# Patient Record
Sex: Male | Born: 1995 | Race: White | Hispanic: No | Marital: Single | Smoking: Current every day smoker
Health system: Southern US, Community
[De-identification: ages and names within clinical notes are randomized; demographics above are authoritative.]

---

## 2016-08-11 ENCOUNTER — Emergency Department (HOSPITAL_BASED_OUTPATIENT_CLINIC_OR_DEPARTMENT_OTHER): Payer: Self-pay

## 2016-08-11 ENCOUNTER — Emergency Department (HOSPITAL_BASED_OUTPATIENT_CLINIC_OR_DEPARTMENT_OTHER)
Admission: EM | Admit: 2016-08-11 | Discharge: 2016-08-11 | Disposition: A | Discharge status: Home / Self Care | Payer: Self-pay | Attending: Emergency Medicine | Admitting: Emergency Medicine

## 2016-08-11 ENCOUNTER — Encounter (HOSPITAL_BASED_OUTPATIENT_CLINIC_OR_DEPARTMENT_OTHER): Payer: Self-pay | Admitting: *Deleted

## 2016-08-11 DIAGNOSIS — Y999 Unspecified external cause status: Secondary | ICD-10-CM | POA: Insufficient documentation

## 2016-08-11 DIAGNOSIS — F17210 Nicotine dependence, cigarettes, uncomplicated: Secondary | ICD-10-CM | POA: Insufficient documentation

## 2016-08-11 DIAGNOSIS — Y939 Activity, unspecified: Secondary | ICD-10-CM | POA: Insufficient documentation

## 2016-08-11 DIAGNOSIS — S62346A Nondisplaced fracture of base of fifth metacarpal bone, right hand, initial encounter for closed fracture: Secondary | ICD-10-CM | POA: Insufficient documentation

## 2016-08-11 DIAGNOSIS — Y929 Unspecified place or not applicable: Secondary | ICD-10-CM | POA: Insufficient documentation

## 2016-08-11 DIAGNOSIS — W2201XA Walked into wall, initial encounter: Secondary | ICD-10-CM | POA: Insufficient documentation

## 2016-08-11 MED ORDER — OXYCODONE-ACETAMINOPHEN 5-325 MG PO TABS: 1.0000 | ORAL_TABLET | Freq: Four times a day (QID) | ORAL | 0 refills | Status: DC | PRN

## 2016-08-11 MED ORDER — OXYCODONE-ACETAMINOPHEN 5-325 MG PO TABS
1.0000 | ORAL_TABLET | Freq: Once | ORAL | Status: AC
  Administered 2016-08-11: 1 via ORAL
  Filled 2016-08-11: qty 1

## 2016-08-11 NOTE — ED Triage Notes (Signed)
Pt punched wall and has pain in right hand.

## 2016-08-11 NOTE — ED Provider Notes (Signed)
[facility]   CSN: [id number] Arrival date & time: 08/11/16  2024  By signing my name below, I, Rosario Adie, attest that this documentation has been prepared under the direction and in the presence of Audry Pili, PA-C.  Electronically Signed: Rosario Adie, ED Scribe. 08/11/16. 10:14 PM.  History   Chief Complaint Chief Complaint  Patient presents with  . Hand Pain   The history is provided by the patient. No language interpreter was used.   HPI Comments: Stephen Dunn is a 20 y.o. male with no pertinent PMhx, who presents to the Emergency Department complaining of sudden onset, gradually worsening, constant right hand pain onset ~5 hours ago. He notes associated mild edema to the area of pain. Pt reports that he got upset and punched a wall, sustaining his hand pain. His pain is exacerbated with any movement and palpation. No treatments were tried prior to coming into the ED. Denies weakness, numbness. No other symptoms noted.     History reviewed. No pertinent past medical history.  There are no active problems to display for this patient.  History reviewed. No pertinent surgical history.  Home Medications    Prior to Admission medications   Not on File   Family History No family history on file.  Social History Social History  Substance Use Topics  . Smoking status: Current Every Day Smoker    Packs/day: 1.00    Types: Cigarettes  . Smokeless tobacco: Not on file  . Alcohol use No   Allergies   Review of patient's allergies indicates no known allergies.  Review of Systems Review of Systems  Musculoskeletal: Positive for arthralgias (right hand) and myalgias.  Neurological: Negative for weakness and numbness.   Physical Exam Updated Vital Signs BP (!) 162/112   Pulse 104   Temp 98.3 F (36.8 C) (Oral)   Resp 16   Ht 6' (1.829 m)   Wt 150 lb (68 kg)   SpO2 98%   BMI 20.34 kg/m   Physical Exam  Constitutional:  He is oriented to person, place, and time. Vital signs are normal. He appears well-developed and well-nourished.  HENT:  Head: Normocephalic.  Right Ear: Hearing normal.  Left Ear: Hearing normal.  Eyes: Conjunctivae and EOM are normal. Pupils are equal, round, and reactive to light.  Cardiovascular: Normal rate and regular rhythm.   Pulmonary/Chest: Effort normal. No respiratory distress.  Abdominal: He exhibits no distension.  Musculoskeletal:  Mild swelling on the dorsal aspect of the base of the fifth MCP. NV intact. Pulses, motor, and sensation intact distally. Normal ROM of the wrist.   Neurological: He is alert and oriented to person, place, and time.  Skin: Skin is warm and dry.  Psychiatric: He has a normal mood and affect. His speech is normal and behavior is normal. Thought content normal.  Nursing note and vitals reviewed.  ED Treatments / Results  DIAGNOSTIC STUDIES: Oxygen Saturation is 98% on RA, normal by my interpretation.   COORDINATION OF CARE: 10:13 PM-Discussed next steps with pt. Pt verbalized understanding and is agreeable with the plan.   Radiology Dg Hand Complete Right  Result Date: 08/11/2016 CLINICAL DATA:  Struck a wall 2 hours ago, hand pain EXAM: RIGHT HAND - COMPLETE 3+ VIEW COMPARISON:  None. FINDINGS: Three views of the right hand submitted. There is mild impacted fracture at the base of right fifth metacarpal. IMPRESSION: Mild impacted fracture at the base of right fifth metacarpal. Electronically Signed  By: Natasha MeadLiviu  Pop M.D.  On: 08/11/2016 21:31   Procedures Procedures   Medications Ordered in ED Medications - No data to display  Initial Impression / Assessment and Plan / ED Course  I have reviewed the triage vital signs and the nursing notes.  Pertinent labs & imaging results that were available during my care of the patient were reviewed by me and considered in my medical decision making (see chart for details).  Clinical Course   I  have reviewed and evaluated the relevant imaging studies. I obtained HPI from historian.  ED Course:  Assessment: Patient X-Ray remarkable for mild impacted fx at the right base of the base of the right fifth MCP. Pain managed in ED. Pt advised to follow up with orthopedics promptly after d/c for continued at home care of injuries. Patient given splint while in ED, conservative therapy recommended and discussed. Patient will be dc home & is agreeable with above plan.  Disposition/Plan:  DC Home Additional Verbal discharge instructions given and discussed with patient.  Pt Instructed to f/u with PCP in the next week for evaluation and treatment of symptoms. Return precautions given Pt acknowledges and agrees with plan  Supervising Physician Lavera Guiseana Duo Liu, MD  Final Clinical Impressions(s) / ED Diagnoses   Final diagnoses:  Closed nondisp fracture of base of fifth metacarpal bone of right hand, initial encounter    New Prescriptions New Prescriptions   No medications on file   I personally performed the services described in this documentation, which was scribed in my presence. The recorded information has been reviewed and is accurate.     Audry Piliyler Arturo Freundlich, PA-C 08/11/16 [id number]    Lavera Guiseana Duo Liu, MD 08/12/16 1034

## 2016-08-11 NOTE — Discharge Instructions (Signed)
Please read and follow all provided instructions.  Your diagnoses today include:  1. Closed nondisp fracture of base of fifth metacarpal bone of right hand, initial encounter     Tests performed today include: Vital signs. See below for your results today.   Medications prescribed:  Take as prescribed   Home care instructions:  Follow any educational materials contained in this packet.  Follow-up instructions: Please follow-up with Orthopedics  for further evaluation of symptoms and treatment   Return instructions:  Please return to the Emergency Department if you do not get better, if you get worse, or new symptoms OR  - Fever (temperature greater than 101.53F)  - Bleeding that does not stop with holding pressure to the area    -Severe pain (please note that you may be more sore the day after your accident)  - Chest Pain  - Difficulty breathing  - Severe nausea or vomiting  - Inability to tolerate food and liquids  - Passing out  - Skin becoming red around your wounds  - Change in mental status (confusion or lethargy)  - New numbness or weakness    Please return if you have any other emergent concerns.  Additional Information:  Your vital signs today were: BP 141/94 (BP Location: Right Arm)    Pulse 75    Temp 98.3 F (36.8 C) (Oral)    Resp 18    Ht 6' (1.829 m)    Wt 68 kg    SpO2 99%    BMI 20.34 kg/m  If your blood pressure (BP) was elevated above 135/85 this visit, please have this repeated by your doctor within one month. ---------------

## 2016-08-11 NOTE — ED Notes (Signed)
PA at bedside.

## 2017-01-10 ENCOUNTER — Encounter (HOSPITAL_BASED_OUTPATIENT_CLINIC_OR_DEPARTMENT_OTHER): Payer: Self-pay | Admitting: *Deleted

## 2017-01-10 ENCOUNTER — Emergency Department (HOSPITAL_BASED_OUTPATIENT_CLINIC_OR_DEPARTMENT_OTHER)
Admission: EM | Admit: 2017-01-10 | Discharge: 2017-01-10 | Disposition: A | Discharge status: Home / Self Care | Payer: Self-pay | Attending: Physician Assistant | Admitting: Physician Assistant

## 2017-01-10 DIAGNOSIS — R05 Cough: Secondary | ICD-10-CM | POA: Insufficient documentation

## 2017-01-10 DIAGNOSIS — R112 Nausea with vomiting, unspecified: Secondary | ICD-10-CM | POA: Insufficient documentation

## 2017-01-10 DIAGNOSIS — R197 Diarrhea, unspecified: Secondary | ICD-10-CM | POA: Insufficient documentation

## 2017-01-10 DIAGNOSIS — R109 Unspecified abdominal pain: Secondary | ICD-10-CM | POA: Insufficient documentation

## 2017-01-10 DIAGNOSIS — R0781 Pleurodynia: Secondary | ICD-10-CM | POA: Insufficient documentation

## 2017-01-10 DIAGNOSIS — F17210 Nicotine dependence, cigarettes, uncomplicated: Secondary | ICD-10-CM | POA: Insufficient documentation

## 2017-01-10 LAB — CBC WITH DIFFERENTIAL/PLATELET
BASOS PCT: 1 %
Basophils Absolute: 0 10*3/uL (ref 0.0–0.1)
EOS ABS: 0.3 10*3/uL (ref 0.0–0.7)
EOS PCT: 7 %
HCT: 47.8 % (ref 39.0–52.0)
HEMOGLOBIN: 16.8 g/dL (ref 13.0–17.0)
Lymphocytes Relative: 28 %
Lymphs Abs: 1.1 10*3/uL (ref 0.7–4.0)
MCH: 30.1 pg (ref 26.0–34.0)
MCHC: 35.1 g/dL (ref 30.0–36.0)
MCV: 85.7 fL (ref 78.0–100.0)
Monocytes Absolute: 0.8 10*3/uL (ref 0.1–1.0)
Monocytes Relative: 22 %
NEUTROS PCT: 42 %
Neutro Abs: 1.6 10*3/uL — ABNORMAL LOW (ref 1.7–7.7)
PLATELETS: 126 10*3/uL — AB (ref 150–400)
RBC: 5.58 MIL/uL (ref 4.22–5.81)
RDW: 12.6 % (ref 11.5–15.5)
WBC: 3.8 10*3/uL — AB (ref 4.0–10.5)

## 2017-01-10 LAB — COMPREHENSIVE METABOLIC PANEL
ALBUMIN: 4.1 g/dL (ref 3.5–5.0)
ALK PHOS: 49 U/L (ref 38–126)
ALT: 28 U/L (ref 17–63)
ANION GAP: 8 (ref 5–15)
AST: 51 U/L — ABNORMAL HIGH (ref 15–41)
BUN: 11 mg/dL (ref 6–20)
CALCIUM: 8.9 mg/dL (ref 8.9–10.3)
CHLORIDE: 96 mmol/L — AB (ref 101–111)
CO2: 29 mmol/L (ref 22–32)
Creatinine, Ser: 1 mg/dL (ref 0.61–1.24)
GFR calc Af Amer: >60 mL/min (ref >60)
GFR calc non Af Amer: >60 mL/min (ref >60)
GLUCOSE: 97 mg/dL (ref 65–99)
Potassium: 3.3 mmol/L — ABNORMAL LOW (ref 3.5–5.1)
Sodium: 133 mmol/L — ABNORMAL LOW (ref 135–145)
Total Bilirubin: 0.7 mg/dL (ref 0.3–1.2)
Total Protein: 7.6 g/dL (ref 6.5–8.1)

## 2017-01-10 LAB — URINALYSIS, ROUTINE W REFLEX MICROSCOPIC
BILIRUBIN URINE: NEGATIVE
Glucose, UA: NEGATIVE mg/dL
Hgb urine dipstick: NEGATIVE
KETONES UR: NEGATIVE mg/dL
Leukocytes, UA: NEGATIVE
NITRITE: NEGATIVE
Protein, ur: NEGATIVE mg/dL
Specific Gravity, Urine: 1.013 (ref 1.005–1.030)
pH: 6 (ref 5.0–8.0)

## 2017-01-10 MED ORDER — ALBUTEROL SULFATE (2.5 MG/3ML) 0.083% IN NEBU
5.0000 mg | INHALATION_SOLUTION | Freq: Once | RESPIRATORY_TRACT | Status: AC
  Administered 2017-01-10: 5 mg via RESPIRATORY_TRACT
  Filled 2017-01-10: qty 6

## 2017-01-10 MED ORDER — ONDANSETRON HCL 4 MG PO TABS: 4.0000 mg | ORAL_TABLET | Freq: Three times a day (TID) | ORAL | 0 refills | Status: AC | PRN

## 2017-01-10 MED ORDER — SODIUM CHLORIDE 0.9 % IV BOLUS (SEPSIS)
1000.0000 mL | Freq: Once | INTRAVENOUS | Status: AC
  Administered 2017-01-10: 1000 mL via INTRAVENOUS

## 2017-01-10 MED ORDER — ONDANSETRON HCL 4 MG/2ML IJ SOLN
4.0000 mg | Freq: Once | INTRAMUSCULAR | Status: AC
  Administered 2017-01-10: 4 mg via INTRAVENOUS
  Filled 2017-01-10: qty 2

## 2017-01-10 NOTE — ED Notes (Signed)
EDP into room, prior to RN assessment, see MD notes, pending orders.   

## 2017-01-10 NOTE — Discharge Instructions (Signed)
You were seen today for vomiting and diarrhea. Your labs are reassuring. Please use Zofran to help with your symptoms. Please return with any concerns

## 2017-01-10 NOTE — ED Provider Notes (Signed)
[facility]   CSN: [id number] Arrival date & time: 01/10/17  2056  By signing my name below, I, Modena JanskyAlbert Thayil, attest that this documentation has been prepared under the direction and in the presence of Keoki Mchargue Randall AnLyn Manhattan Mccuen, MD. Electronically Signed: Modena JanskyAlbert Thayil, Scribe. 01/10/2017. 10:17 PM.  History   Chief Complaint Chief Complaint  Patient presents with  . Emesis   The history is provided by the patient. No language interpreter was used.   HPI Comments: Stephen Dunn is a 21 y.o. male who presents to the Emergency Department complaining of intermittent moderate cough that started a few days ago. He states he went to [facility] 2 days ago for his gradually worsening URI-like symptoms. He was discharged with cough syrup and an antibiotic with minimal relief. He came to the ED today due to his unchanged progression. He has been taking aleve for his pain with some relief. He reports associated symptoms of rib pain (secondary to cough), abdominal pain (secondary to cough), diarrhea, vomiting, and decreased appetite (not eaten in past 3 days). He denies any other complaints.   History reviewed. No pertinent past medical history.  There are no active problems to display for this patient.   History reviewed. No pertinent surgical history.     Home Medications    Prior to Admission medications   Not on File    Family History History reviewed. No pertinent family history.  Social History Social History  Substance Use Topics  . Smoking status: Current Every Day Smoker    Packs/day: 1.00    Types: Cigarettes  . Smokeless tobacco: Not on file  . Alcohol use No     Allergies   Patient has no known allergies.   Review of Systems Review of Systems  Constitutional: Positive for appetite change.  Cardiovascular: Positive for chest pain (Rib pain due to pain).  Gastrointestinal: Positive for abdominal pain (from cough), diarrhea and  vomiting.  All other systems reviewed and are negative.    Physical Exam Updated Vital Signs BP (!) 159/102 (BP Location: Left Arm)   Pulse 76   Temp 98.1 F (36.7 C) (Oral)   Resp 20   Ht 5\' 9"  (1.753 m)   Wt 150 lb (68 kg)   SpO2 100%   BMI 22.15 kg/m   Physical Exam  Constitutional: He appears well-developed and well-nourished. No distress.  HENT:  Head: Normocephalic and atraumatic.  Eyes: Conjunctivae are normal.  Neck: Neck supple.  Cardiovascular: Normal rate and regular rhythm.   Pulmonary/Chest: Effort normal. No respiratory distress. He has no wheezes. He has no rales.  Abdominal: Soft. There is no tenderness.  Musculoskeletal: Normal range of motion.  Neurological: He is alert.  Skin: Skin is warm and dry.  Psychiatric: He has a normal mood and affect.  Nursing note and vitals reviewed.    ED Treatments / Results  DIAGNOSTIC STUDIES: Oxygen Saturation is 100% on RA, normal by my interpretation.    COORDINATION OF CARE: 10:21 PM- Pt advised of plan for treatment and pt agrees.  Labs (all labs ordered are listed, but only abnormal results are displayed) Labs Reviewed  URINALYSIS, ROUTINE W REFLEX MICROSCOPIC  CBC WITH DIFFERENTIAL/PLATELET  COMPREHENSIVE METABOLIC PANEL    EKG  EKG Interpretation None       Radiology No results found.  Procedures Procedures (including critical care time)  Medications Ordered in ED Medications  sodium chloride 0.9 % bolus 1,000 mL (not administered)  ondansetron (ZOFRAN)  injection 4 mg (not administered)     Initial Impression / Assessment and Plan / ED Course  I have reviewed the triage vital signs and the nursing notes.  Pertinent labs & imaging results that were available during my care of the patient were reviewed by me and considered in my medical decision making (see chart for details).     I personally performed the services described in this documentation, which was scribed in my presence.  The recorded information has been reviewed and is accurate.    Patient is a 21 year old male who is had a week of symptoms. These are flulike symptoms. Patient reports he had cough, nausea, vomiting. Seen at [facility] and given azithromycin and cough medicine. Patient did not have any testing at that time. Patient says he's been vomiting a lot. We will get CBC and Chem-7 give fluids give Zofran and likely be able to discharge home given his reassuring vital signs. Patient has normal abdomen exam and appears well-hydrated.  Final Clinical Impressions(s) / ED Diagnoses   Final diagnoses:  None    New Prescriptions New Prescriptions   No medications on file     Zannie Runkle Randall An, MD 01/10/17 2316

## 2017-01-10 NOTE — ED Notes (Signed)
Attempted to get urine specimen from pt, but he states he just went. Given instructions to obtain.

## 2017-01-10 NOTE — ED Notes (Signed)
denies questions or needs, VSS, given Rx x1, steady gait, tolerated PO fluids/ crackers.

## 2017-01-10 NOTE — ED Triage Notes (Addendum)
Pt reports that [facility]  Given cough syrup and antibiotics.  Reports that he hasn't urinated or eaten in 3 day.  States that he has been able to drink sodas but has not been eating.  Pt reports rib pain when coughing. No cough noted in triage.

## 2018-05-23 ENCOUNTER — Emergency Department (HOSPITAL_BASED_OUTPATIENT_CLINIC_OR_DEPARTMENT_OTHER): Payer: No Typology Code available for payment source

## 2018-05-23 ENCOUNTER — Encounter (HOSPITAL_BASED_OUTPATIENT_CLINIC_OR_DEPARTMENT_OTHER): Payer: Self-pay | Admitting: Emergency Medicine

## 2018-05-23 ENCOUNTER — Emergency Department (HOSPITAL_BASED_OUTPATIENT_CLINIC_OR_DEPARTMENT_OTHER)
Admission: EM | Admit: 2018-05-23 | Discharge: 2018-05-23 | Disposition: A | Discharge status: Home / Self Care | Payer: No Typology Code available for payment source | Attending: Emergency Medicine | Admitting: Emergency Medicine

## 2018-05-23 ENCOUNTER — Other Ambulatory Visit: Payer: Self-pay

## 2018-05-23 DIAGNOSIS — R2241 Localized swelling, mass and lump, right lower limb: Secondary | ICD-10-CM | POA: Insufficient documentation

## 2018-05-23 DIAGNOSIS — Y9389 Activity, other specified: Secondary | ICD-10-CM | POA: Diagnosis not present

## 2018-05-23 DIAGNOSIS — S8011XA Contusion of right lower leg, initial encounter: Secondary | ICD-10-CM | POA: Diagnosis not present

## 2018-05-23 DIAGNOSIS — S99911A Unspecified injury of right ankle, initial encounter: Secondary | ICD-10-CM | POA: Diagnosis present

## 2018-05-23 DIAGNOSIS — Y929 Unspecified place or not applicable: Secondary | ICD-10-CM | POA: Insufficient documentation

## 2018-05-23 DIAGNOSIS — F17210 Nicotine dependence, cigarettes, uncomplicated: Secondary | ICD-10-CM | POA: Diagnosis not present

## 2018-05-23 DIAGNOSIS — Y998 Other external cause status: Secondary | ICD-10-CM | POA: Insufficient documentation

## 2018-05-23 MED ORDER — HYDROCODONE-ACETAMINOPHEN 5-325 MG PO TABS
1.0000 | ORAL_TABLET | Freq: Once | ORAL | Status: AC
  Administered 2018-05-23: 1 via ORAL
  Filled 2018-05-23: qty 1

## 2018-05-23 MED ORDER — MELOXICAM 7.5 MG PO TABS: 7.5000 mg | ORAL_TABLET | Freq: Every day | ORAL | 0 refills | Status: AC

## 2018-05-23 NOTE — Discharge Instructions (Signed)
You can take Tylenol or Ibuprofen as directed for pain. You can alternate Tylenol and Ibuprofen every 4 hours. If you take Tylenol at 1pm, then you can take Ibuprofen at 5pm. Then you can take Tylenol again at 9pm.   If the tylenol or ibuprofen does not work, try to the Mobic to help with symptoms. Do not take them at the same time.   As we discussed, why you are at home, elevate the leg above heart level to help with soft tissue swelling.  Additionally, apply ice to the affected area.  As we discussed, monitor your symptoms closely, return to the emergency department immediately if the swelling gets worse, your leg gets hard or firm, the leg gets discolored, you have numbness or weakness, worsening pain or any other worsening or concerning symptoms.

## 2018-05-23 NOTE — ED Notes (Signed)
ED Provider at bedside. 

## 2018-05-23 NOTE — ED Notes (Signed)
Pt out of room for testing. 
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
